# Patient Record
Sex: Female | Born: 1935 | Race: White | Hispanic: No
Health system: Midwestern US, Academic
[De-identification: ages and names within clinical notes are randomized; demographics above are authoritative.]

---

## 2016-11-07 MED ORDER — AMIODARONE 200 MG PO TAB
100 mg | ORAL_TABLET | Freq: Every day | ORAL | 3 refills | 42.00000 days | Status: DC
Start: 2016-11-07 — End: 2017-07-08

## 2017-01-16 ENCOUNTER — Encounter: Admit: 2017-01-16 | Discharge: 2017-01-16 | Payer: MEDICARE

## 2017-01-17 MED ORDER — HYDROCHLOROTHIAZIDE 25 MG PO TAB
ORAL_TABLET | Freq: Every day | ORAL | 0 refills | 28.00000 days | Status: AC
Start: 2017-01-17 — End: 2017-04-14

## 2017-03-22 ENCOUNTER — Encounter: Admit: 2017-03-22 | Discharge: 2017-03-22 | Payer: MEDICARE

## 2017-03-25 MED ORDER — LISINOPRIL 20 MG PO TAB
ORAL_TABLET | Freq: Two times a day (BID) | 0 refills | Status: AC
Start: 2017-03-25 — End: 2017-05-05

## 2017-04-13 ENCOUNTER — Encounter: Admit: 2017-04-13 | Discharge: 2017-04-13 | Payer: MEDICARE

## 2017-04-14 MED ORDER — HYDROCHLOROTHIAZIDE 25 MG PO TAB
ORAL_TABLET | Freq: Every day | ORAL | 0 refills | 28.00000 days | Status: AC
Start: 2017-04-14 — End: ?

## 2017-04-14 NOTE — Telephone Encounter
Called and left message requesting callback to schedule appointment.

## 2017-05-04 ENCOUNTER — Encounter: Admit: 2017-05-04 | Discharge: 2017-05-04 | Payer: MEDICARE

## 2017-05-05 MED ORDER — LISINOPRIL 20 MG PO TAB
ORAL_TABLET | Freq: Two times a day (BID) | 0 refills | Status: AC
Start: 2017-05-05 — End: ?

## 2017-05-27 ENCOUNTER — Encounter: Admit: 2017-05-27 | Discharge: 2017-05-27 | Payer: MEDICARE

## 2017-05-27 LAB — COMPREHENSIVE METABOLIC PANEL
Lab: 0.9
Lab: 10
Lab: 16 — ABNORMAL HIGH (ref 0–14)
Lab: 17
Lab: 3.9
Lab: 67

## 2017-05-28 MED ORDER — HYDROCHLOROTHIAZIDE 25 MG PO TAB
ORAL_TABLET | Freq: Every day | 0 refills
Start: 2017-05-28 — End: ?

## 2017-05-28 NOTE — Telephone Encounter
Last OV from 12/2015 and last BMP on file from 2015. Deferred refill to PCP.

## 2017-06-15 ENCOUNTER — Encounter: Admit: 2017-06-15 | Discharge: 2017-06-15 | Payer: MEDICARE

## 2017-06-17 MED ORDER — POTASSIUM CHLORIDE 10 MEQ PO TBTQ
ORAL_TABLET | Freq: Two times a day (BID) | 2 refills
Start: 2017-06-17 — End: ?

## 2017-06-24 ENCOUNTER — Encounter: Admit: 2017-06-24 | Discharge: 2017-06-24 | Payer: MEDICARE

## 2017-06-24 NOTE — Telephone Encounter
Patient past due for OV.  Refill deferred to PCP.

## 2017-07-07 ENCOUNTER — Encounter: Admit: 2017-07-07 | Discharge: 2017-07-07 | Payer: MEDICARE

## 2017-07-08 ENCOUNTER — Encounter: Admit: 2017-07-08 | Discharge: 2017-07-08 | Payer: MEDICARE

## 2017-07-08 DIAGNOSIS — I10 Essential (primary) hypertension: ICD-10-CM

## 2017-07-08 DIAGNOSIS — Z79899 Other long term (current) drug therapy: Principal | ICD-10-CM

## 2017-07-08 MED ORDER — POTASSIUM CHLORIDE 10 MEQ PO TBTQ
ORAL_TABLET | Freq: Two times a day (BID) | 0 refills | 30.00000 days | Status: AC
Start: 2017-07-08 — End: ?

## 2017-07-08 MED ORDER — AMIODARONE 200 MG PO TAB
100 mg | ORAL_TABLET | Freq: Every day | ORAL | 0 refills | 42.00000 days | Status: AC
Start: 2017-07-08 — End: 2018-05-26

## 2017-07-08 NOTE — Progress Notes
Labs and/or CXR for amiodarone surveillance due per Amiodarone protocol.  Lab requisitions/orders and letter explaining need for testing mailed to patient or given to patient in clinic.

## 2017-08-20 ENCOUNTER — Encounter: Admit: 2017-08-20 | Discharge: 2017-08-20 | Payer: MEDICARE

## 2017-09-03 ENCOUNTER — Encounter: Admit: 2017-09-03 | Discharge: 2017-09-03 | Payer: MEDICARE

## 2017-09-03 MED ORDER — POTASSIUM CHLORIDE 10 MEQ PO TBTQ
ORAL_TABLET | Freq: Two times a day (BID) | 0 refills
Start: 2017-09-03 — End: ?

## 2017-09-24 ENCOUNTER — Encounter: Admit: 2017-09-24 | Discharge: 2017-09-24 | Payer: MEDICARE

## 2017-09-24 MED ORDER — POTASSIUM CHLORIDE 10 MEQ PO TBTQ
ORAL_TABLET | Freq: Two times a day (BID) | 0 refills
Start: 2017-09-24 — End: ?

## 2017-10-28 LAB — COMPREHENSIVE METABOLIC PANEL
Lab: 0.6
Lab: 1.5 — ABNORMAL HIGH (ref 0.57–1.11)
Lab: 10
Lab: 133 — ABNORMAL HIGH (ref 83–110)
Lab: 142
Lab: 15
Lab: 16 — ABNORMAL HIGH (ref 0–14)
Lab: 3.6
Lab: 34 — ABNORMAL LOW (ref >59)
Lab: 37 — ABNORMAL HIGH (ref 9.8–20.1)
Lab: 6.9
Lab: 69
Lab: 9.6

## 2017-10-28 LAB — HEMOGLOBIN A1C: Lab: 6.2

## 2017-10-28 LAB — FREE T4 (FREE THYROXINE) ONLY: Lab: 1

## 2017-10-28 LAB — THYROID STIMULATING HORMONE-TSH: Lab: 6.7 — ABNORMAL HIGH (ref 0.35–4.94)

## 2018-02-24 ENCOUNTER — Encounter: Admit: 2018-02-24 | Discharge: 2018-02-24 | Payer: MEDICARE

## 2018-05-20 ENCOUNTER — Encounter: Admit: 2018-05-20 | Discharge: 2018-05-20 | Payer: MEDICARE

## 2018-05-20 DIAGNOSIS — I4891 Unspecified atrial fibrillation: Principal | ICD-10-CM

## 2018-05-21 ENCOUNTER — Encounter: Admit: 2018-05-21 | Discharge: 2018-05-21 | Payer: MEDICARE

## 2018-05-21 DIAGNOSIS — I509 Heart failure, unspecified: ICD-10-CM

## 2018-05-21 DIAGNOSIS — I10 Essential (primary) hypertension: Principal | ICD-10-CM

## 2018-05-21 DIAGNOSIS — R609 Edema, unspecified: ICD-10-CM

## 2018-05-21 DIAGNOSIS — E119 Type 2 diabetes mellitus without complications: ICD-10-CM

## 2018-05-21 DIAGNOSIS — R0600 Dyspnea, unspecified: ICD-10-CM

## 2018-05-21 DIAGNOSIS — E785 Hyperlipidemia, unspecified: ICD-10-CM

## 2018-05-26 ENCOUNTER — Ambulatory Visit: Admit: 2018-05-26 | Discharge: 2018-05-27 | Payer: MEDICARE

## 2018-05-26 ENCOUNTER — Encounter: Admit: 2018-05-26 | Discharge: 2018-05-26 | Payer: MEDICARE

## 2018-05-26 DIAGNOSIS — R0600 Dyspnea, unspecified: ICD-10-CM

## 2018-05-26 DIAGNOSIS — E119 Type 2 diabetes mellitus without complications: ICD-10-CM

## 2018-05-26 DIAGNOSIS — I509 Heart failure, unspecified: ICD-10-CM

## 2018-05-26 DIAGNOSIS — I480 Paroxysmal atrial fibrillation: ICD-10-CM

## 2018-05-26 DIAGNOSIS — E785 Hyperlipidemia, unspecified: ICD-10-CM

## 2018-05-26 DIAGNOSIS — I10 Essential (primary) hypertension: Principal | ICD-10-CM

## 2018-05-26 DIAGNOSIS — R609 Edema, unspecified: ICD-10-CM

## 2018-05-26 MED ORDER — APIXABAN 2.5 MG PO TAB
2.5 mg | ORAL_TABLET | Freq: Two times a day (BID) | ORAL | 11 refills | Status: AC
Start: 2018-05-26 — End: 2018-06-23

## 2018-05-27 ENCOUNTER — Encounter: Admit: 2018-05-27 | Discharge: 2018-05-27 | Payer: MEDICARE

## 2018-05-28 ENCOUNTER — Encounter: Admit: 2018-05-28 | Discharge: 2018-05-28 | Payer: MEDICARE

## 2018-05-28 DIAGNOSIS — I10 Essential (primary) hypertension: Principal | ICD-10-CM

## 2018-05-28 DIAGNOSIS — I509 Heart failure, unspecified: ICD-10-CM

## 2018-05-28 DIAGNOSIS — E785 Hyperlipidemia, unspecified: ICD-10-CM

## 2018-05-28 DIAGNOSIS — R609 Edema, unspecified: ICD-10-CM

## 2018-05-28 DIAGNOSIS — E119 Type 2 diabetes mellitus without complications: ICD-10-CM

## 2018-05-28 DIAGNOSIS — R0600 Dyspnea, unspecified: ICD-10-CM

## 2018-06-10 ENCOUNTER — Ambulatory Visit: Admit: 2018-06-10 | Discharge: 2018-06-11 | Payer: MEDICARE

## 2018-06-10 DIAGNOSIS — I480 Paroxysmal atrial fibrillation: ICD-10-CM

## 2018-06-10 DIAGNOSIS — I509 Heart failure, unspecified: Principal | ICD-10-CM

## 2018-06-11 ENCOUNTER — Encounter: Admit: 2018-06-11 | Discharge: 2018-06-11 | Payer: MEDICARE

## 2018-06-15 ENCOUNTER — Encounter: Admit: 2018-06-15 | Discharge: 2018-06-15 | Payer: MEDICARE

## 2018-06-23 ENCOUNTER — Ambulatory Visit: Admit: 2018-06-23 | Discharge: 2018-06-24 | Payer: MEDICARE

## 2018-06-23 ENCOUNTER — Encounter: Admit: 2018-06-23 | Discharge: 2018-06-23 | Payer: MEDICARE

## 2018-06-23 DIAGNOSIS — E119 Type 2 diabetes mellitus without complications: ICD-10-CM

## 2018-06-23 DIAGNOSIS — I429 Cardiomyopathy, unspecified: ICD-10-CM

## 2018-06-23 DIAGNOSIS — I10 Essential (primary) hypertension: ICD-10-CM

## 2018-06-23 DIAGNOSIS — E785 Hyperlipidemia, unspecified: ICD-10-CM

## 2018-06-23 DIAGNOSIS — E782 Mixed hyperlipidemia: ICD-10-CM

## 2018-06-23 DIAGNOSIS — I480 Paroxysmal atrial fibrillation: Principal | ICD-10-CM

## 2018-06-23 DIAGNOSIS — I509 Heart failure, unspecified: ICD-10-CM

## 2018-06-23 DIAGNOSIS — R0600 Dyspnea, unspecified: ICD-10-CM

## 2018-06-23 DIAGNOSIS — R609 Edema, unspecified: ICD-10-CM

## 2018-06-23 MED ORDER — APIXABAN 2.5 MG PO TAB
2.5 mg | ORAL_TABLET | Freq: Two times a day (BID) | ORAL | 3 refills | Status: AC
Start: 2018-06-23 — End: 2019-08-12

## 2018-06-23 MED ORDER — HYDRALAZINE 10 MG PO TAB
10 mg | ORAL_TABLET | Freq: Three times a day (TID) | ORAL | 3 refills | 30.00000 days | Status: AC
Start: 2018-06-23 — End: 2019-08-02

## 2018-06-28 ENCOUNTER — Encounter: Admit: 2018-06-28 | Discharge: 2018-06-28 | Payer: MEDICARE

## 2018-06-28 DIAGNOSIS — R609 Edema, unspecified: ICD-10-CM

## 2018-06-28 DIAGNOSIS — E785 Hyperlipidemia, unspecified: ICD-10-CM

## 2018-06-28 DIAGNOSIS — E119 Type 2 diabetes mellitus without complications: ICD-10-CM

## 2018-06-28 DIAGNOSIS — I10 Essential (primary) hypertension: Principal | ICD-10-CM

## 2018-06-28 DIAGNOSIS — R0600 Dyspnea, unspecified: ICD-10-CM

## 2018-06-28 DIAGNOSIS — I509 Heart failure, unspecified: ICD-10-CM

## 2018-09-21 ENCOUNTER — Encounter: Admit: 2018-09-21 | Discharge: 2018-09-21 | Payer: MEDICARE

## 2019-08-02 ENCOUNTER — Encounter: Admit: 2019-08-02 | Discharge: 2019-08-02 | Payer: MEDICARE

## 2019-08-02 MED ORDER — HYDRALAZINE 10 MG PO TAB
10 mg | ORAL_TABLET | Freq: Three times a day (TID) | ORAL | 0 refills | 30.00000 days | Status: DC
Start: 2019-08-02 — End: 2019-10-27

## 2019-08-12 ENCOUNTER — Encounter: Admit: 2019-08-12 | Discharge: 2019-08-12 | Payer: MEDICARE

## 2019-08-12 MED ORDER — ELIQUIS 2.5 MG PO TAB
ORAL_TABLET | Freq: Two times a day (BID) | 3 refills | Status: AC
Start: 2019-08-12 — End: ?

## 2019-08-13 ENCOUNTER — Encounter: Admit: 2019-08-13 | Discharge: 2019-08-13 | Payer: MEDICARE

## 2019-10-27 ENCOUNTER — Encounter: Admit: 2019-10-27 | Discharge: 2019-10-27 | Payer: MEDICARE

## 2019-10-27 MED ORDER — HYDRALAZINE 10 MG PO TAB
ORAL_TABLET | Freq: Three times a day (TID) | ORAL | 0 refills | 30.00000 days | Status: DC
Start: 2019-10-27 — End: 2020-01-26

## 2020-01-26 ENCOUNTER — Encounter: Admit: 2020-01-26 | Discharge: 2020-01-26 | Payer: MEDICARE

## 2020-01-26 MED ORDER — HYDRALAZINE 10 MG PO TAB
ORAL_TABLET | Freq: Three times a day (TID) | ORAL | 0 refills | 30.00000 days | Status: AC
Start: 2020-01-26 — End: ?

## 2020-01-26 NOTE — Telephone Encounter
Spoke with patient's daughter regarding scheduling OV. Pt wants to switch to a provider that goes to the [facility] clinic. Provided scheduling number so pt could call back later this afternoon.

## 2020-04-05 ENCOUNTER — Encounter: Admit: 2020-04-05 | Discharge: 2020-04-05 | Payer: MEDICARE

## 2020-04-05 NOTE — Telephone Encounter
Pt's daughter called saying patient is having stomach issues and would like to cancel appointment with Dr. Gwen Pounds on 9/22. Pt's daughter stated her mom's heart is doing just fine and they are going to get her in with her PCP that day instead. Pt's daughter given scheduling phone number to reschedule appointment.

## 2020-04-12 ENCOUNTER — Encounter: Admit: 2020-04-12 | Discharge: 2020-04-12 | Payer: MEDICARE

## 2020-04-26 ENCOUNTER — Encounter: Admit: 2020-04-26 | Discharge: 2020-04-26 | Payer: MEDICARE

## 2020-04-26 MED ORDER — HYDRALAZINE 10 MG PO TAB
ORAL_TABLET | Freq: Three times a day (TID) | ORAL | 3 refills | 30.00000 days | Status: AC
Start: 2020-04-26 — End: ?

## 2020-06-12 IMAGING — CR CHEST
2 series · 2 of 2 positions shown · non-contrast
Comparison: none

[chest pa x-wise]
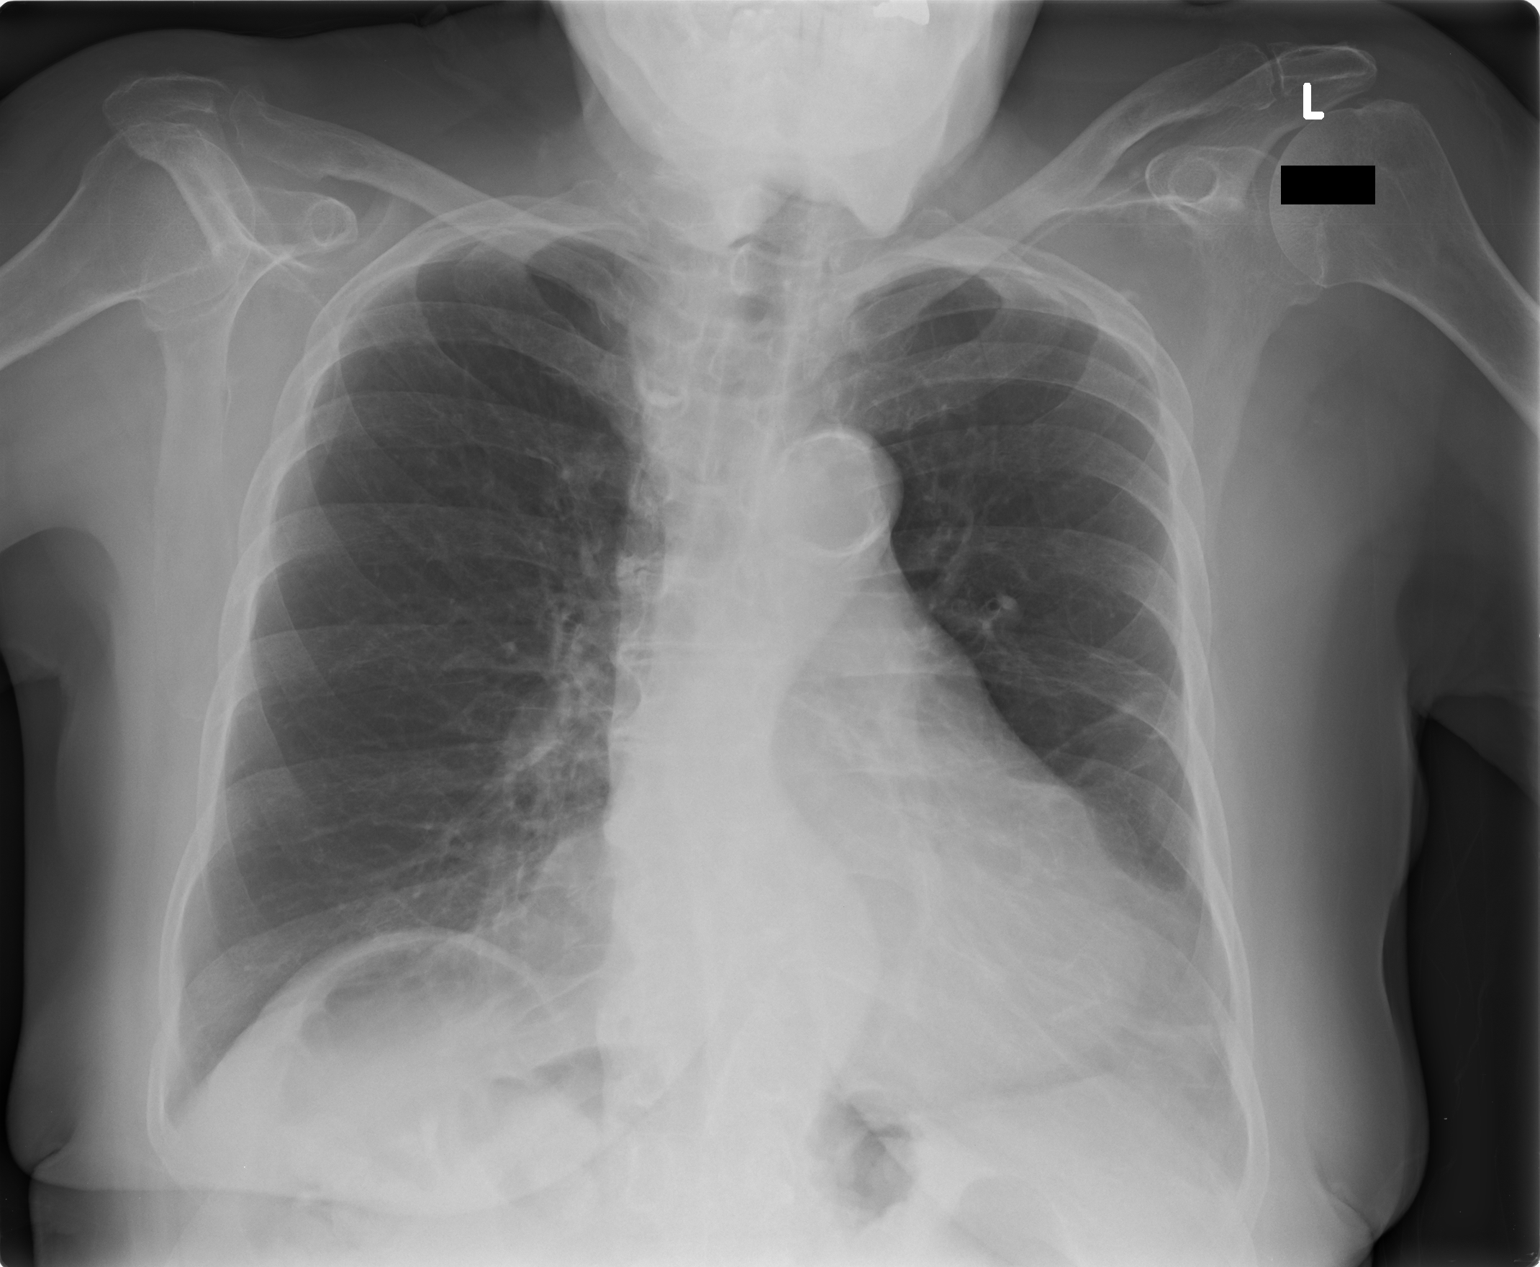

[chest lat]
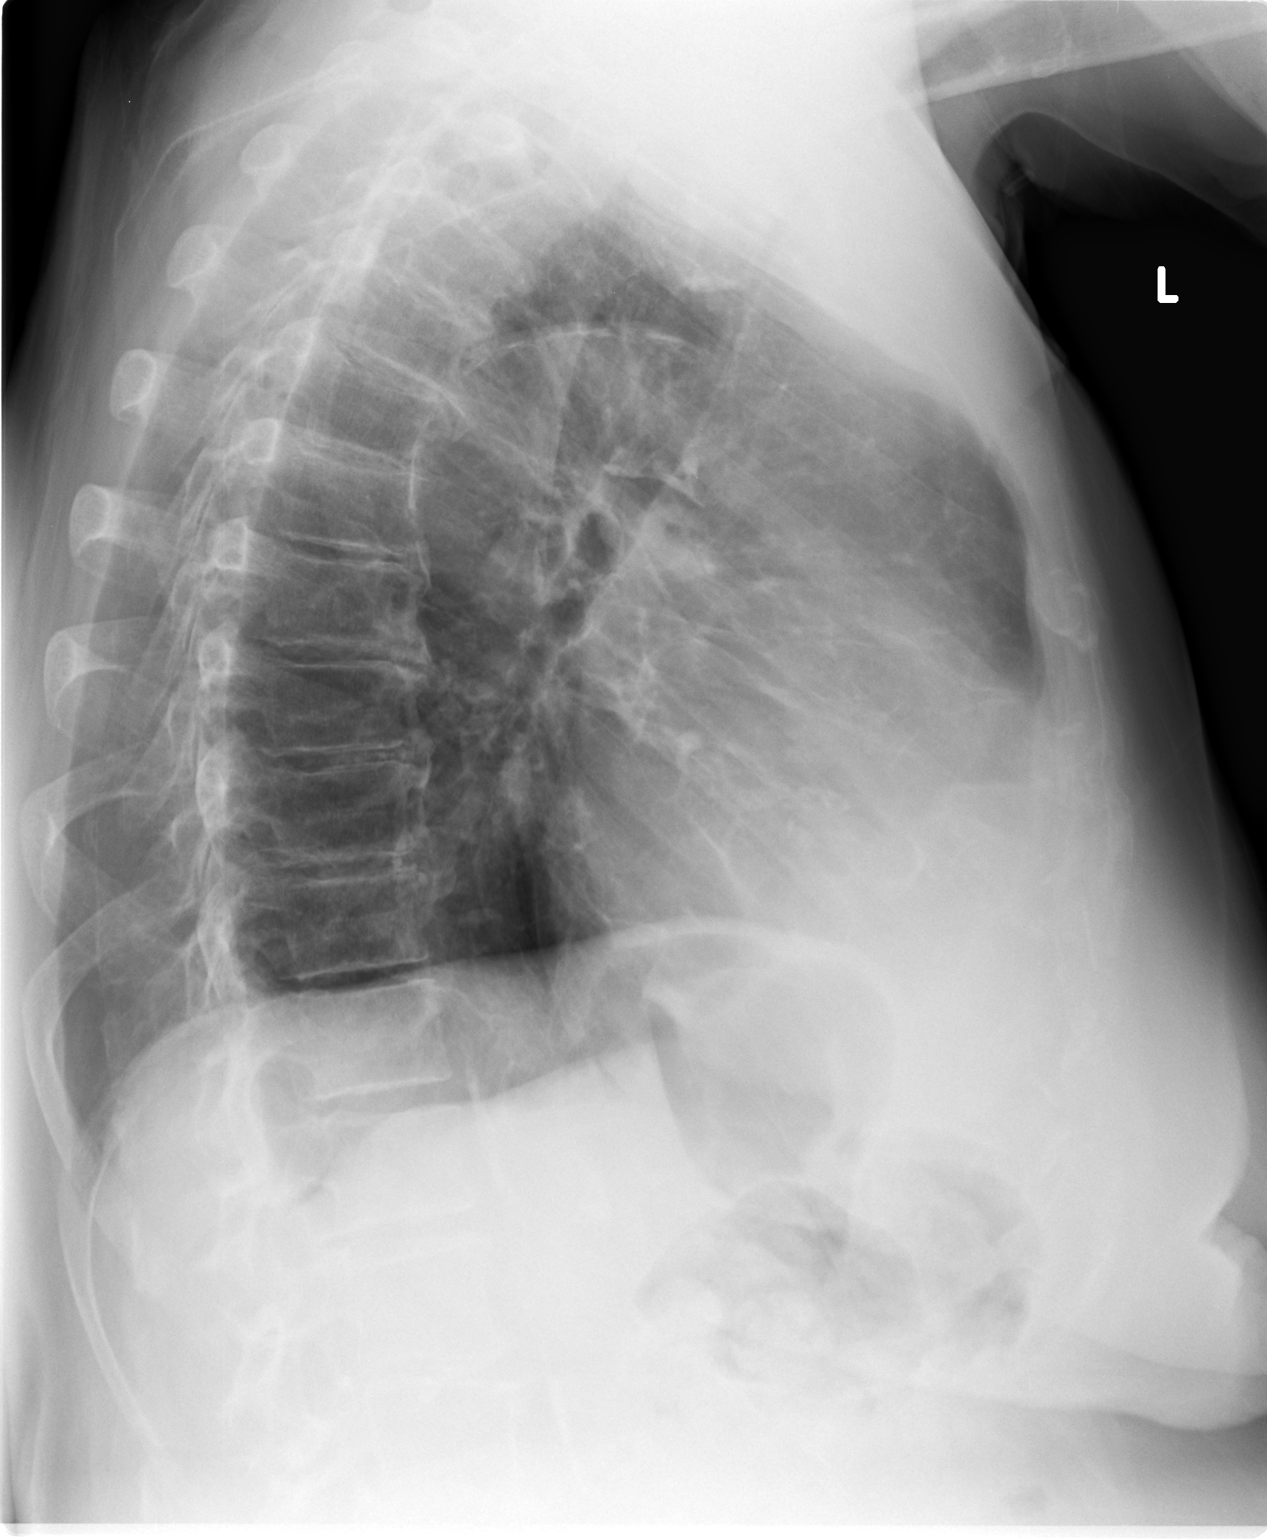

[2 of 2 positions shown; findings below may reference images not displayed]

Ordering:    ERXLEBEN, FERIENHAUS

EXAM
RADIOLOGICAL EXAMINATION, CHEST; 2 VIEWS FRONTAL AND LATERAL CPT 17363

INDICATION
I509.PT C/O A-FIB. CK/TJ

TECHNIQUE
2 views of the chest were acquired.

COMPARISONS
Previous examination dated 11/28/2014.

FINDINGS
The cardiac silhouette is mildly enlarged. The lungs are free of focal consolidations. Chronic
interstitial changes are noted. The aortic arch is calcified. The aorta is tortuous and uncoiled.
The pulmonary vasculature is normal in caliber. The bony structures are adequately mineralized.
There is mild thoracic spondylosis.

IMPRESSION
Cardiomegaly. Calcified aortic arch. Chronic interstitial changes.

Tech Notes:

PT C/O A-FIB. CK/TJ

## 2020-08-06 ENCOUNTER — Encounter: Admit: 2020-08-06 | Discharge: 2020-08-06 | Payer: MEDICARE

## 2020-08-06 MED ORDER — ELIQUIS 2.5 MG PO TAB
ORAL_TABLET | Freq: Two times a day (BID) | 3 refills
Start: 2020-08-06 — End: ?

## 2020-10-04 ENCOUNTER — Encounter: Admit: 2020-10-04 | Discharge: 2020-10-04 | Payer: MEDICARE

## 2020-10-04 MED ORDER — ELIQUIS 2.5 MG PO TAB
ORAL_TABLET | Freq: Two times a day (BID) | 3 refills
Start: 2020-10-04 — End: ?

## 2020-10-05 ENCOUNTER — Encounter: Admit: 2020-10-05 | Discharge: 2020-10-05 | Payer: MEDICARE

## 2020-10-05 DIAGNOSIS — I10 Essential (primary) hypertension: Secondary | ICD-10-CM

## 2020-10-05 DIAGNOSIS — E782 Mixed hyperlipidemia: Secondary | ICD-10-CM

## 2020-10-05 DIAGNOSIS — I509 Heart failure, unspecified: Secondary | ICD-10-CM

## 2020-12-13 ENCOUNTER — Encounter: Admit: 2020-12-13 | Discharge: 2020-12-13 | Payer: MEDICARE

## 2020-12-13 NOTE — Progress Notes
Contacted patient's daughter by phone on 12/13/2020 regarding lab orders for patient for CMP and Lipid Panel ordered per Dr. Danella Maiers that has not been completed. Requested patient have this drawn before next scheduled appointment with [facility] on 12/19/2020. Patient's daughter requested lab orders be faxed to [facility] (FAX: [phone]). Faxed lab orders per patient's daughter's request. Notified patient's daughter of fasting instructions for patient's lab draw. Patient's daughter voiced verbal understanding and will have lab drawn before scheduled appointment with [facility] on 12/19/2020.

## 2020-12-15 ENCOUNTER — Encounter: Admit: 2020-12-15 | Discharge: 2020-12-15 | Payer: MEDICARE

## 2020-12-15 DIAGNOSIS — I509 Heart failure, unspecified: Secondary | ICD-10-CM

## 2020-12-15 DIAGNOSIS — I10 Essential (primary) hypertension: Secondary | ICD-10-CM

## 2020-12-15 DIAGNOSIS — E782 Mixed hyperlipidemia: Secondary | ICD-10-CM

## 2020-12-15 LAB — LIPID PROFILE
CHOLESTEROL: 187
LDL: 118 — ABNORMAL HIGH (ref <100)
VLDL: 16

## 2020-12-15 LAB — COMPREHENSIVE METABOLIC PANEL
BLD UREA NITROGEN: 33 — ABNORMAL HIGH (ref 9.8–20.1)
CALCIUM: 9.3
CREATININE: 1.1 — ABNORMAL HIGH (ref 0.57–1.11)
GLUCOSE,PANEL: 116 — ABNORMAL HIGH (ref 70–105)
POTASSIUM: 4.4
SODIUM: 142
TOTAL PROTEIN: 6.7 mg/dL (ref 2.0–7.0)

## 2020-12-19 ENCOUNTER — Encounter: Admit: 2020-12-19 | Discharge: 2020-12-19 | Payer: MEDICARE

## 2020-12-19 DIAGNOSIS — I509 Heart failure, unspecified: Secondary | ICD-10-CM

## 2020-12-19 DIAGNOSIS — E782 Mixed hyperlipidemia: Secondary | ICD-10-CM

## 2020-12-19 DIAGNOSIS — E119 Type 2 diabetes mellitus without complications: Secondary | ICD-10-CM

## 2020-12-19 DIAGNOSIS — I10 Essential (primary) hypertension: Secondary | ICD-10-CM

## 2020-12-19 DIAGNOSIS — R609 Edema, unspecified: Secondary | ICD-10-CM

## 2020-12-19 DIAGNOSIS — I480 Paroxysmal atrial fibrillation: Secondary | ICD-10-CM

## 2020-12-19 DIAGNOSIS — R0600 Dyspnea, unspecified: Secondary | ICD-10-CM

## 2020-12-19 DIAGNOSIS — E785 Hyperlipidemia, unspecified: Secondary | ICD-10-CM

## 2020-12-19 MED ORDER — HYDROCHLOROTHIAZIDE 25 MG PO TAB
25 mg | ORAL_TABLET | Freq: Every day | ORAL | 3 refills | 28.00000 days | Status: AC
Start: 2020-12-19 — End: ?

## 2020-12-19 MED ORDER — CARVEDILOL 25 MG PO TAB
25 mg | ORAL_TABLET | Freq: Two times a day (BID) | ORAL | 3 refills | 90.00000 days | Status: AC
Start: 2020-12-19 — End: ?

## 2020-12-19 MED ORDER — APIXABAN 2.5 MG PO TAB
2.5 mg | ORAL_TABLET | Freq: Two times a day (BID) | ORAL | 3 refills | Status: AC
Start: 2020-12-19 — End: ?

## 2020-12-19 MED ORDER — POTASSIUM CHLORIDE 10 MEQ PO TBTQ
10 meq | ORAL_TABLET | Freq: Every day | ORAL | 3 refills | 30.00000 days | Status: AC
Start: 2020-12-19 — End: ?

## 2020-12-19 MED ORDER — LISINOPRIL 20 MG PO TAB
20 mg | ORAL_TABLET | Freq: Two times a day (BID) | ORAL | 3 refills | Status: AC
Start: 2020-12-19 — End: ?

## 2020-12-19 NOTE — Assessment & Plan Note
The target for her blood pressure would be an average of 140/90 or less.  I certainly would not want to be too aggressive in blood pressure management, given her problems with gait instability.

## 2020-12-19 NOTE — Progress Notes
Date of Service: 12/19/2020    Sierra Ballard is a 87 y.o. female.       HPI     Sierra Ballard was in the [place] clinic today, accompanied by 2 of her daughters.  She is 73 years old and still lives in her own home.  She gets a lot of assistance from her family and one of her daughters always checks on her at least once or twice per day.    She has a fairly long history of chronic atrial fibrillation.  She was last seen in our clinic in 2019.  She was started on Eliquis at that time and amiodarone was discontinued.    She is starting to have some problems with gait instability and actually fell recently, sustaining a scalp laceration.  Her family is starting to be concerned about whether or not she should remain on oral anticoagulation, but understand the stroke risk involved should she discontinue Eliquis.    The patient herself is not having any symptoms related to atrial fibrillation.  She denies any palpitations and she has had no problems with TIA or stroke symptoms.  She denies any breathlessness or chest discomfort.         Vitals:    12/19/20 1433 12/19/20 1445   BP: (!) 146/100 (!) 142/98   BP Source: Arm, Left Upper Arm, Right Upper   Pulse: 60    SpO2: 100%    O2 Percent: 100 %    O2 Device: None (Room air)    PainSc: Eight    Weight: 57.4 kg (126 lb 9.6 oz)    Height: 157.5 cm (5' 2)      Body mass index is 23.16 kg/m?Sierra Ballard     Past Medical History  Patient Active Problem List    Diagnosis Date Noted   ? Cardiomyopathy, unspecified (HCC) 06/28/2018   ? Low back pain 10/13/2014   ? Falls infrequently 05/13/2013   ? Cough 02/04/2013   ? Chest congestion 02/04/2013   ? A-fib (HCC) 09/22/2012   ? Hypertension 05/06/2012   ? Paroxysmal SVT (supraventricular tachycardia) (HCC) 03/10/2012   ? Osteoarthritis 02/12/2012   ? Renal insufficiency 02/12/2012   ? Diabetes mellitus (HCC) 02/12/2012   ? CHF (congestive heart failure) (HCC) 02/03/2012   ? Edema 02/03/2012   ? Dyspnea 02/03/2012     01/15/2012-Echo: Conclusions  1. Normal left ventricular systolic function.  Mild concentric left ventricular hypertrophy.  Mild enlargement of left ventricle cavity.  Tissue Doppler/Mitral Doppler indices are consistent with pseudonormalization with mildly elevated left atrial pressure (Stage II diastolic dysfunction).  The estimated EF is 60-70%  2.  Right ventricular systolic pressure is consistent with moderate pulmonary hypertension.     ? Hyperlipidemia 02/03/2012         Review of Systems   Constitutional: Positive for malaise/fatigue.   HENT: Negative.    Eyes: Negative.    Cardiovascular: Negative.    Respiratory: Negative.    Endocrine: Negative.    Hematologic/Lymphatic: Negative.    Skin: Negative.    Musculoskeletal: Positive for arthritis, back pain, joint pain, muscle cramps, muscle weakness, myalgias and neck pain.   Gastrointestinal: Positive for abdominal pain, diarrhea and nausea.   Genitourinary: Positive for hesitancy, incomplete emptying and pelvic pain.   Neurological: Positive for excessive daytime sleepiness and weakness.   Psychiatric/Behavioral: Negative.    Allergic/Immunologic: Negative.        Physical Exam    Physical Exam   General Appearance: no distress  Skin: warm, no ulcers or xanthomas   Digits and Nails: no cyanosis or clubbing   Eyes: conjunctivae and lids normal, pupils are equal and round   Teeth/Gums/Palate: dentition unremarkable, no lesions   Lips & Oral Mucosa: no pallor or cyanosis   Neck Veins: normal JVP , neck veins are not distended   Thyroid: no nodules, masses, tenderness or enlargement   Chest Inspection: chest is normal in appearance   Respiratory Effort: breathing comfortably, no respiratory distress   Auscultation/Percussion: lungs clear to auscultation, no rales or rhonchi, no wheezing   PMI: PMI not enlarged or displaced   Cardiac Rhythm: irregular rhythm and normal rate   Cardiac Auscultation: S1, S2 normal, no rub, no gallop   Murmurs: no murmur   Peripheral Circulation: normal peripheral circulation   Carotid Arteries: normal carotid upstroke bilaterally, no bruits   Radial Arteries: normal symmetric radial pulses   Abdominal Aorta: no abdominal aortic bruit   Pedal Pulses: normal symmetric pedal pulses   Lower Extremity Edema: no lower extremity edema   Abdominal Exam: soft, non-tender, no masses, bowel sounds normal   Liver & Spleen: no organomegaly   Gait & Station: in a wheelchair  Muscle Strength: normal muscle tone   Orientation: oriented to time, place and person   Affect & Mood: appropriate and sustained affect   Language and Memory: patient responsive and seems to comprehend information   Neurologic Exam: neurological assessment grossly intact   Other: moves all extremities      Cardiovascular Studies    EKG:  AF, rate 94.    Cardiovascular Health Factors  Vitals BP Readings from Last 3 Encounters:   12/19/20 (!) 142/98   06/23/18 (!) 138/104   06/10/18 (!) 162/122     Wt Readings from Last 3 Encounters:   12/19/20 57.4 kg (126 lb 9.6 oz)   06/23/18 66.2 kg (146 lb)   06/10/18 66.4 kg (146 lb 6.4 oz)     BMI Readings from Last 3 Encounters:   12/19/20 23.16 kg/m?   06/23/18 25.46 kg/m?   06/10/18 25.53 kg/m?      Smoking Social History     Tobacco Use   Smoking Status Never Smoker   Smokeless Tobacco Never Used      Lipid Profile Cholesterol   Date Value Ref Range Status   12/15/2020 187  Final     HDL   Date Value Ref Range Status   12/15/2020 53  Final     LDL   Date Value Ref Range Status   12/15/2020 118 (H) <100 Final     Triglycerides   Date Value Ref Range Status   12/15/2020 78  Final      Blood Sugar Hemoglobin A1C   Date Value Ref Range Status   10/28/2017 6.2  Final     Glucose   Date Value Ref Range Status   12/15/2020 116 (H) 70 - 105 Final   10/28/2017 133 (H) 83 - 110 Final   05/27/2017 116 (H) 83 - 110 Final          Problems Addressed Today  Encounter Diagnoses   Name Primary?   ? Paroxysmal atrial fibrillation (HCC) Yes   ? Congestive heart failure, unspecified HF chronicity, unspecified heart failure type (HCC)    ? Primary hypertension    ? Mixed hyperlipidemia        Assessment and Plan       Hypertension  The target for her blood pressure would  be an average of 140/90 or less.  I certainly would not want to be too aggressive in blood pressure management, given her problems with gait instability.    Hyperlipidemia  Lab Results   Component Value Date    CHOL 187 12/15/2020    TRIG 78 12/15/2020    HDL 53 12/15/2020    LDL 118 (H) 12/15/2020    VLDL 16 12/15/2020    CHOLHDLC 4 12/15/2020          A-fib (HCC)  I gave the patient's daughters information about left atrial appendage occlusion devices today.  I think they have some understandable reluctance to get involved in any sort of invasive procedure, but I really do think the patient is getting to the point that the risk of anticoagulation may start to become prohibitive.  We will check back with them in a couple of weeks to see what they want to do but we can certainly set up an initial consultation with one of my EP colleagues in the [facility] clinic to discuss the procedure a bit further.      Current Medications (including today's revisions)  ? acetaminophen SR(+) (TYLENOL) 650 mg tablet Take 650 mg by mouth twice daily.   ? apixaban (ELIQUIS) 2.5 mg tablet Take one tablet by mouth twice daily.   ? aspirin EC 81 mg tablet Take 81 mg by mouth daily.   ? carvediloL (COREG) 25 mg tablet Take one tablet by mouth twice daily with meals. Take with food.   ? hydrALAZINE (APRESOLINE) 10 mg tablet TAKE 1 TABLET BY MOUTH THREE TIMES A DAY   ? hydroCHLOROthiazide (HYDRODIURIL) 25 mg tablet Take one tablet by mouth daily.   ? insulin detemir U-100(+) (LEVEMIR) 100 unit/mL vial Inject 4 Units under the skin daily.   ? lisinopriL (ZESTRIL) 20 mg tablet Take one tablet by mouth twice daily.   ? polyethylene glycol 3350 (MIRALAX) 17 g packet Take 17 g by mouth as Needed.   ? potassium chloride SR (K-DUR) 10 mEq tablet Take one tablet by mouth daily for 90 days. Take with a meal and a full glass of water.     Total time spent on today's office visit was 45 minutes.  This includes face-to-face in person visit with patient as well as nonface-to-face time including review of the EMR, outside records, labs, radiologic studies, echocardiogram & other cardiovascular studies, formation of treatment plan, after visit summary, future disposition, and lastly on documentation.

## 2020-12-19 NOTE — Assessment & Plan Note
Lab Results   Component Value Date    CHOL 187 12/15/2020    TRIG 78 12/15/2020    HDL 53 12/15/2020    LDL 118 (H) 12/15/2020    VLDL 16 12/15/2020    CHOLHDLC 4 12/15/2020

## 2020-12-19 NOTE — Assessment & Plan Note
I gave the patient's daughters information about left atrial appendage occlusion devices today.  I think they have some understandable reluctance to get involved in any sort of invasive procedure, but I really do think the patient is getting to the point that the risk of anticoagulation may start to become prohibitive.  We will check back with them in a couple of weeks to see what they want to do but we can certainly set up an initial consultation with one of my EP colleagues in the [facility] clinic to discuss the procedure a bit further.

## 2021-03-12 ENCOUNTER — Encounter: Admit: 2021-03-12 | Discharge: 2021-03-12 | Payer: MEDICARE

## 2021-03-12 ENCOUNTER — Ambulatory Visit: Admit: 2021-03-12 | Discharge: 2021-03-12 | Payer: MEDICARE

## 2021-03-12 DIAGNOSIS — N189 Chronic kidney disease, unspecified: Secondary | ICD-10-CM

## 2021-03-12 DIAGNOSIS — T07XXXA Unspecified multiple injuries, initial encounter: Secondary | ICD-10-CM

## 2021-03-12 DIAGNOSIS — I509 Heart failure, unspecified: Secondary | ICD-10-CM

## 2021-03-12 DIAGNOSIS — I4891 Unspecified atrial fibrillation: Secondary | ICD-10-CM

## 2021-04-21 DEATH — deceased

## 2023-04-16 IMAGING — CR HIPCMRT
4 series · 4 of 4 positions shown · non-contrast
Comparison: none

[hip ap pelvis]
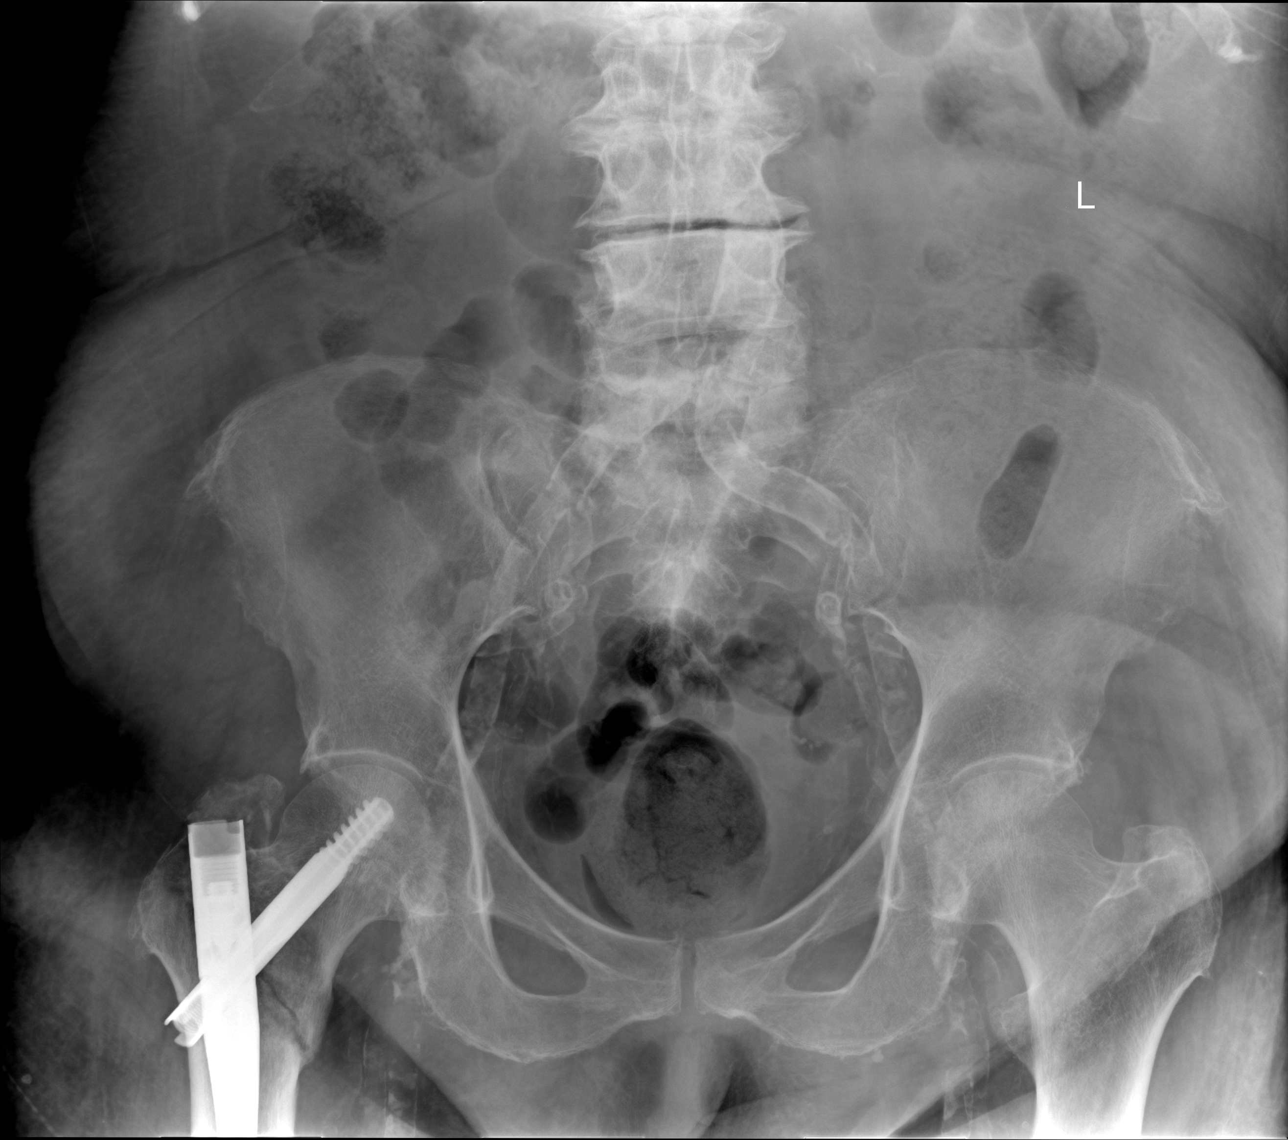

[hip ap (1 of 2)]
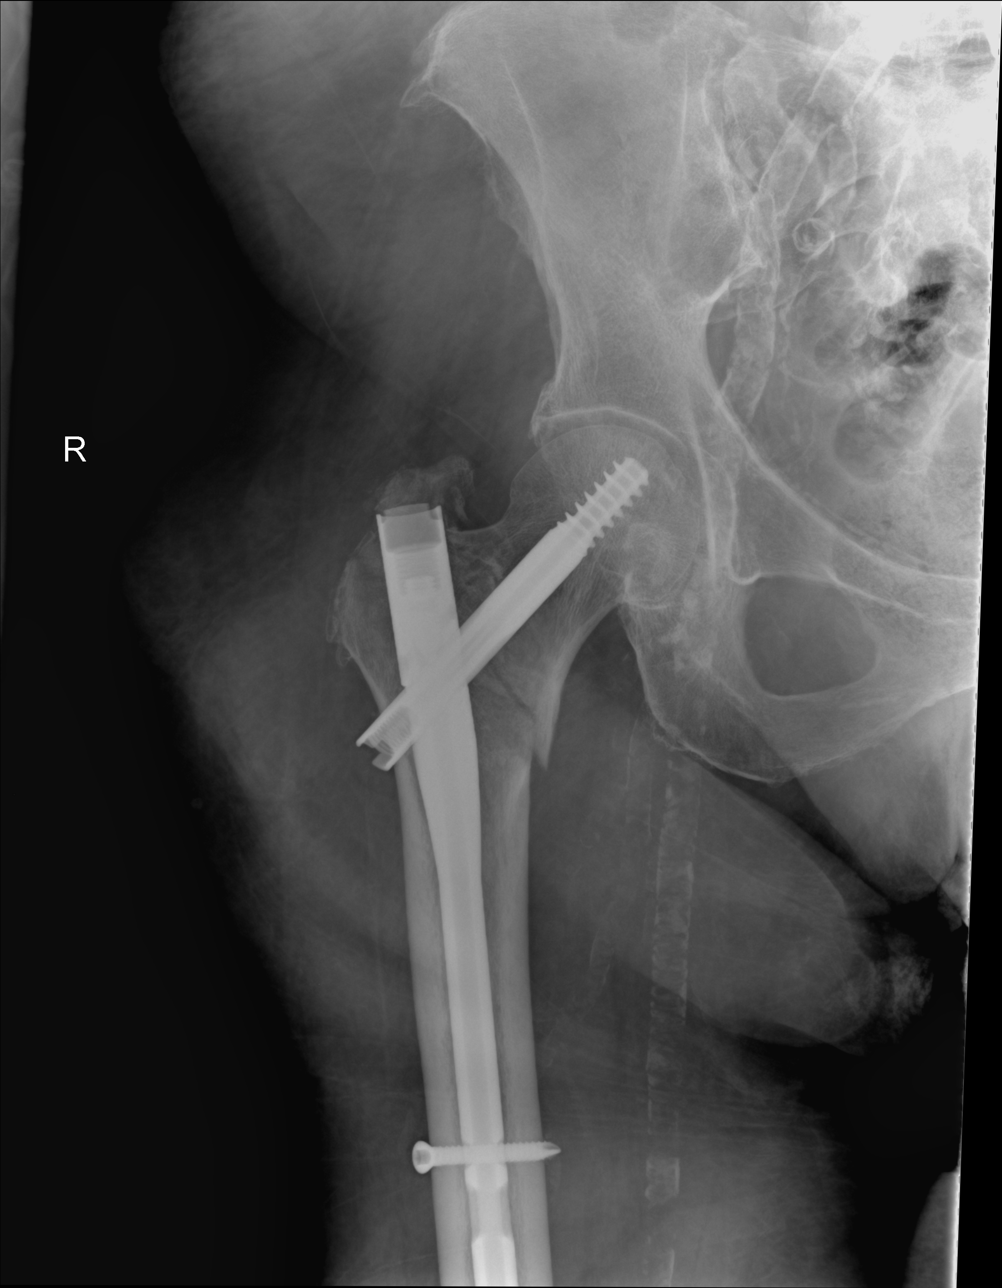

[hip ap (2 of 2)]
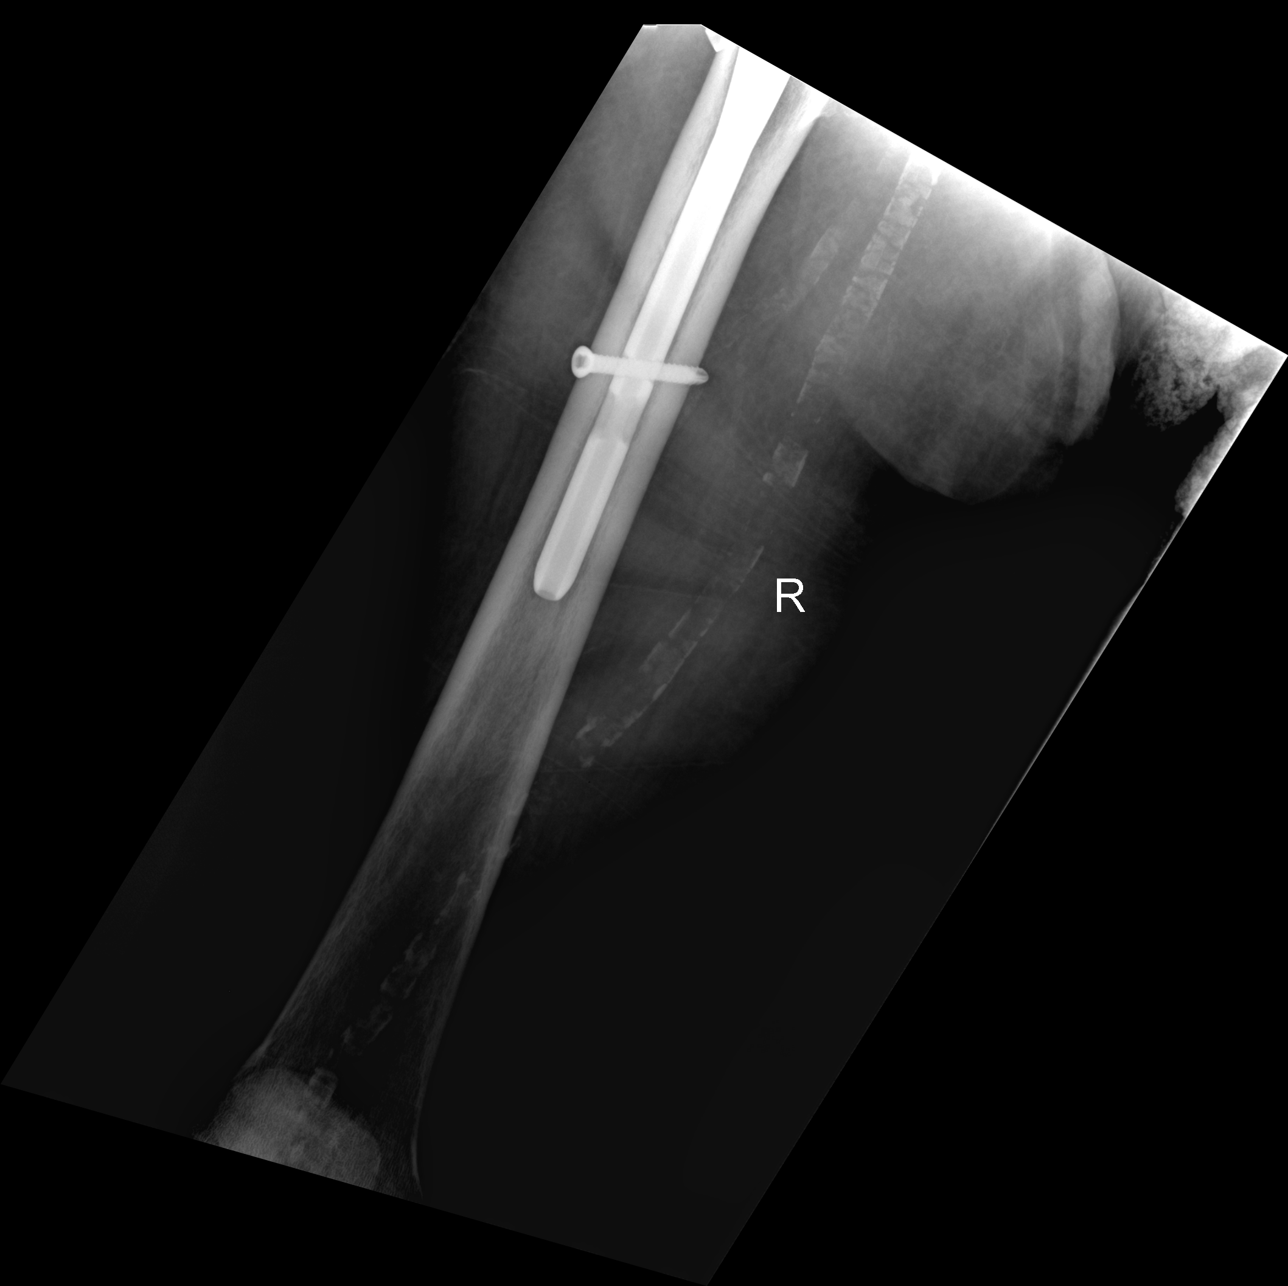

[hip frog lat]
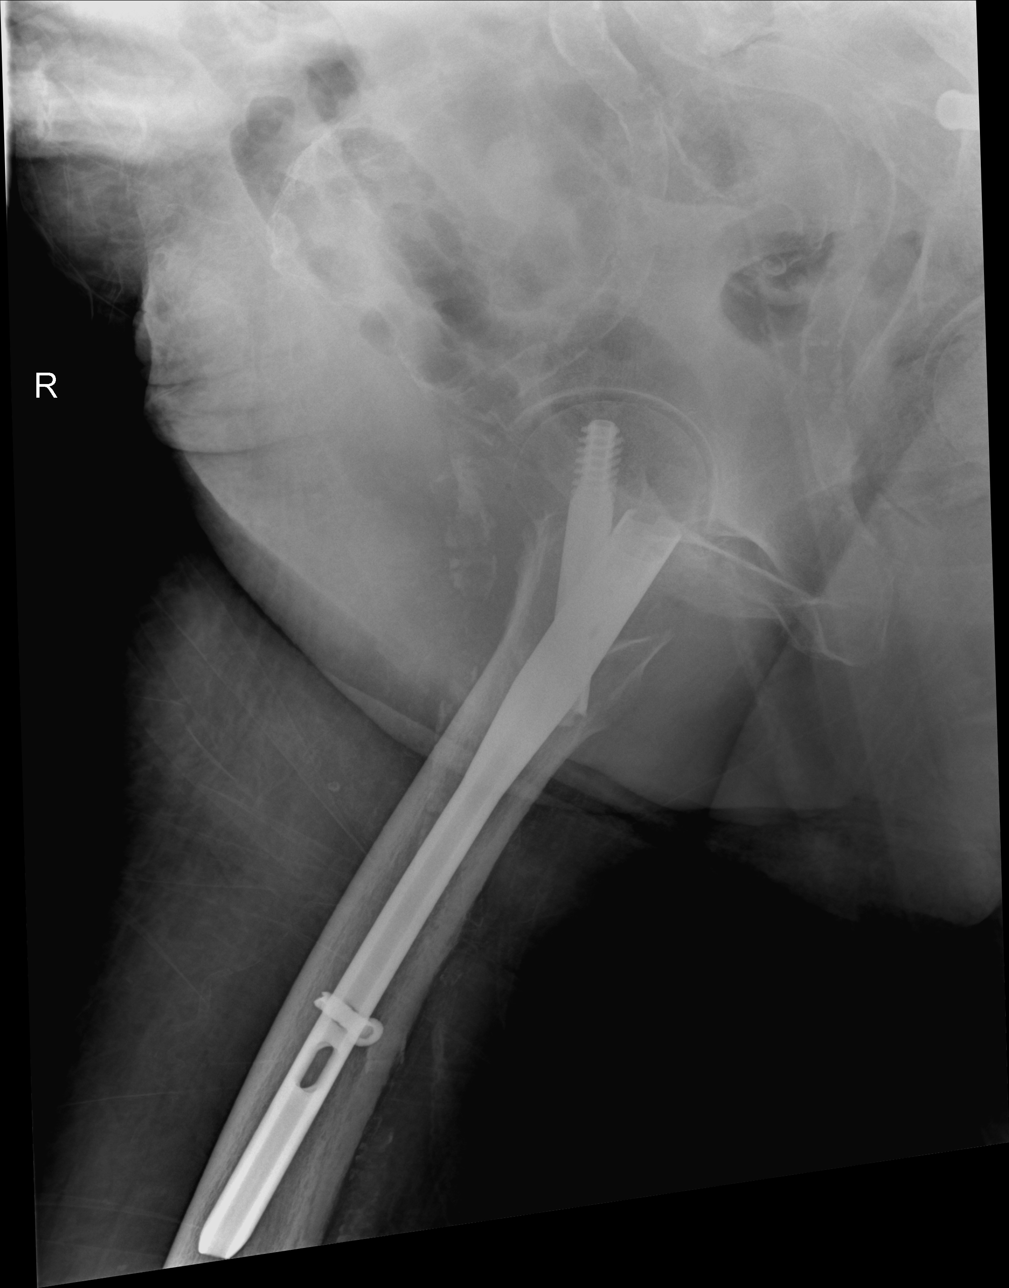

[4 of 4 positions shown; findings below may reference images not displayed]

DIAGNOSTIC STUDIES

EXAM

XR hip RT, 2-3V w or wo pelvis

INDICATION

fx f/u
Follow up fx.

TECHNIQUE

AP pelvis AP and lateral views right hip

COMPARISONS

March 09, 2021

FINDINGS

Patient is status post ORIF of intertrochanteric right hip fracture which demonstrates generally
good anatomic alignment. Fracture line is still evident. No new fractures are seen. Extensive
abdominal and pelvic vascular calcification is noted.

IMPRESSION

Postoperative and posttraumatic changes of the right hip as described.

Tech Notes:

Follow up fx.
